# Patient Record
Sex: Male | Born: 1965
Health system: Southern US, Community
[De-identification: ages and names within clinical notes are randomized; demographics above are authoritative.]

## PROBLEM LIST (undated history)

## (undated) DIAGNOSIS — E785 Hyperlipidemia, unspecified: Secondary | ICD-10-CM

## (undated) HISTORY — DX: Hyperlipidemia, unspecified: E78.5

---

## 2010-08-16 ENCOUNTER — Ambulatory Visit (HOSPITAL_COMMUNITY): Admission: RE | Admit: 2010-08-16 | Discharge: 2010-08-16 | Payer: Self-pay | Admitting: Family Medicine

## 2011-10-17 IMAGING — CR DG FOOT COMPLETE 3+V*L*
3 series · 3 of 3 positions shown · non-contrast
Comparison: None

CLINICAL DATA: Left foot pain, attention fifth MTP joint

LEFT FOOT - COMPLETE 3+ VIEW

[view not recorded (1 of 3)]
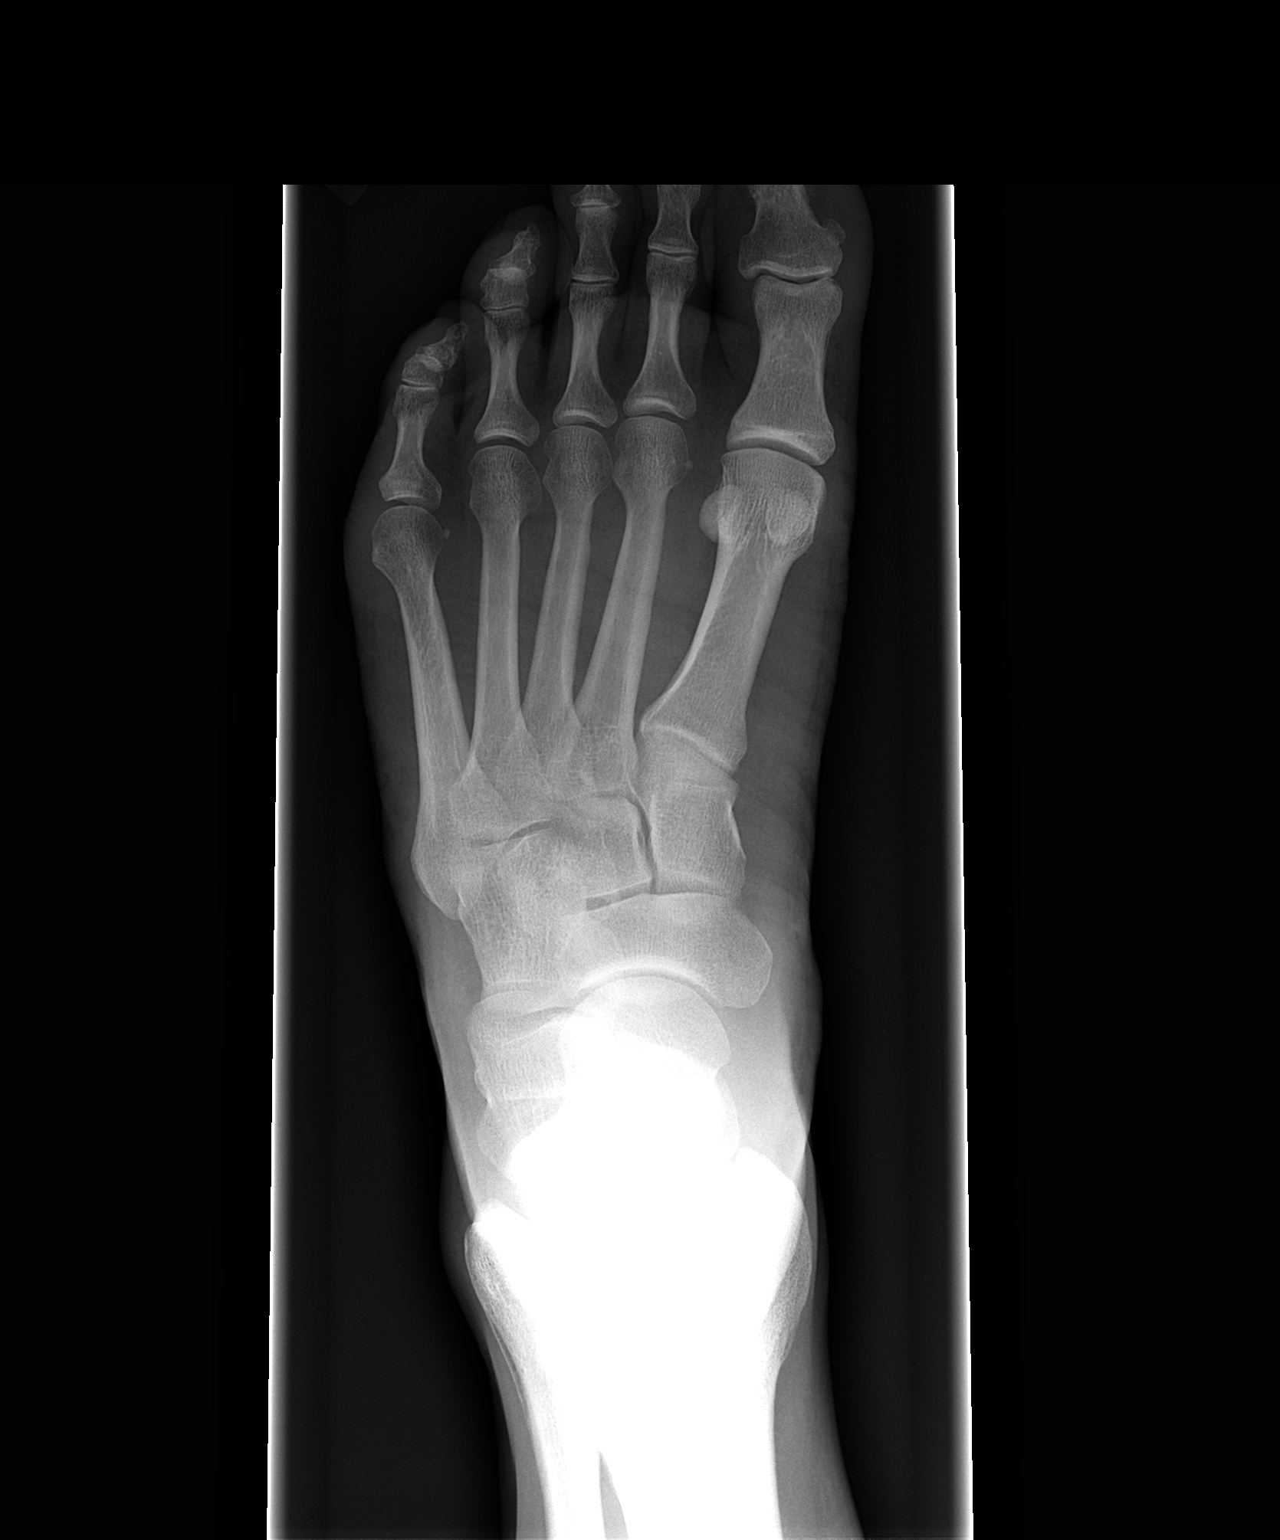

[view not recorded (2 of 3)]
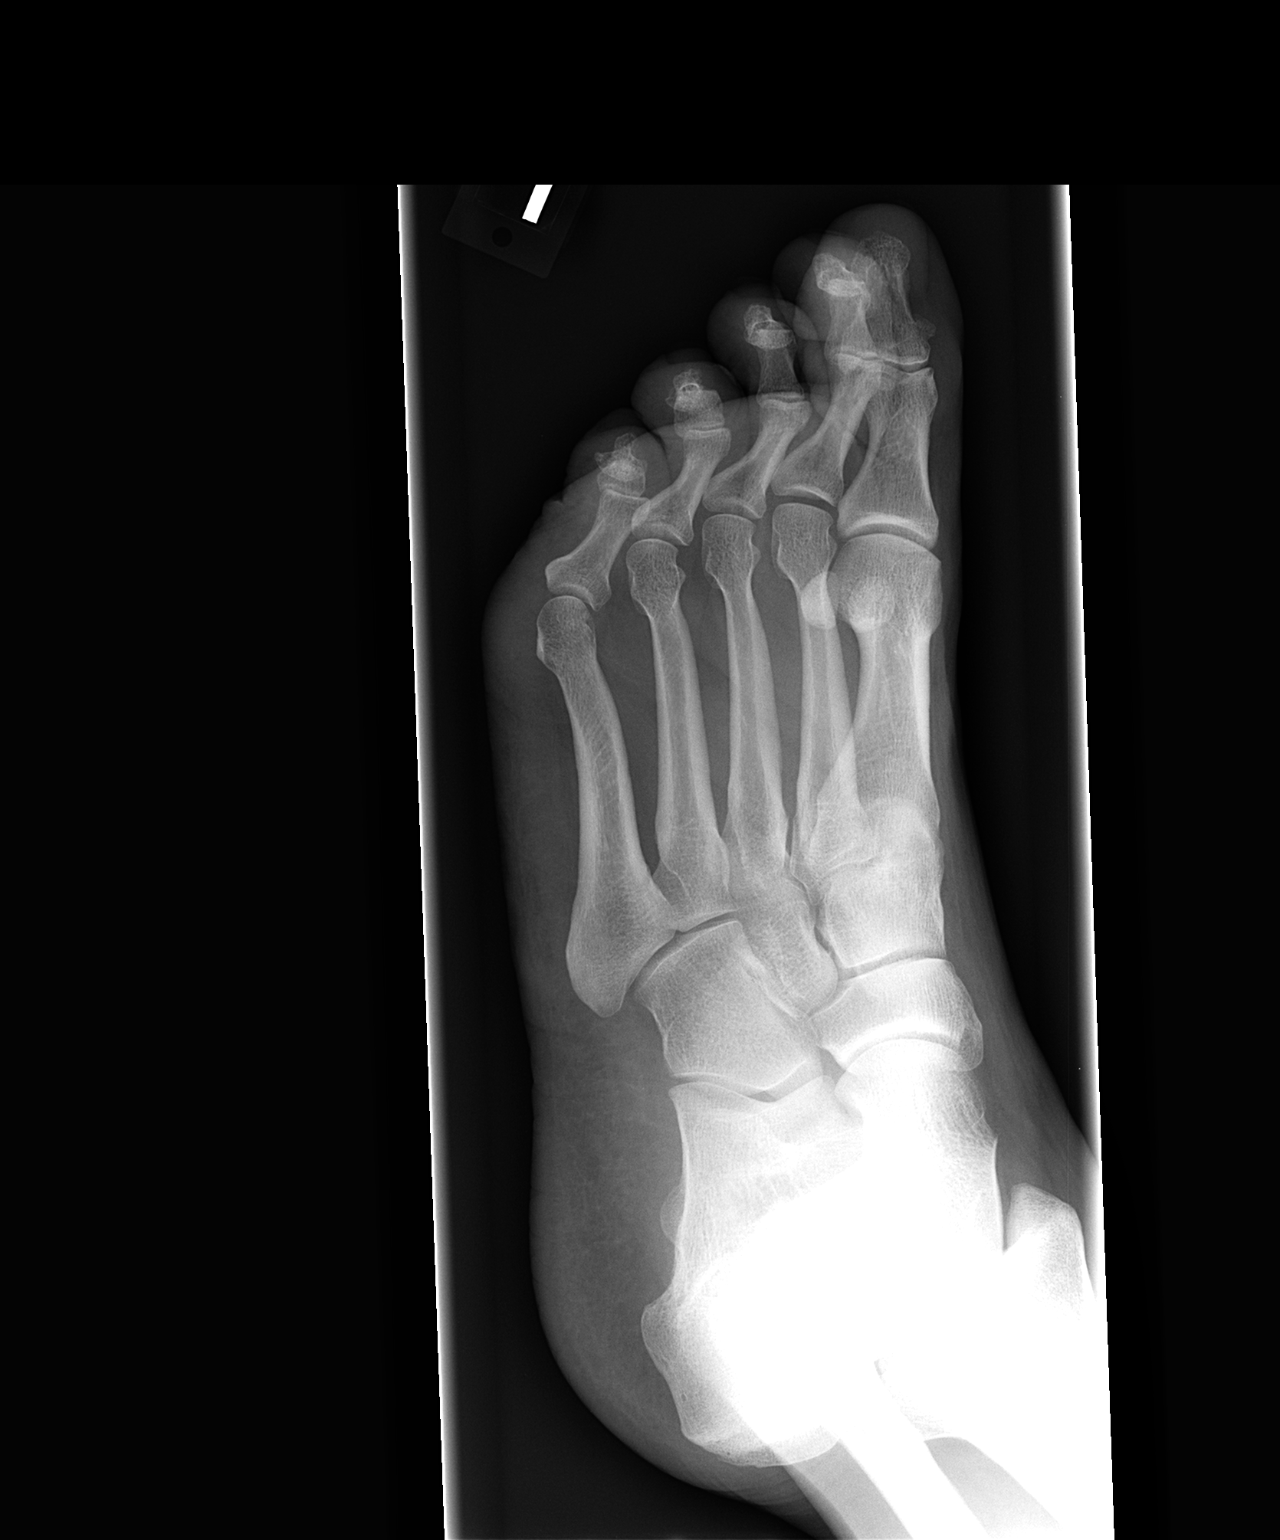

[view not recorded (3 of 3)]
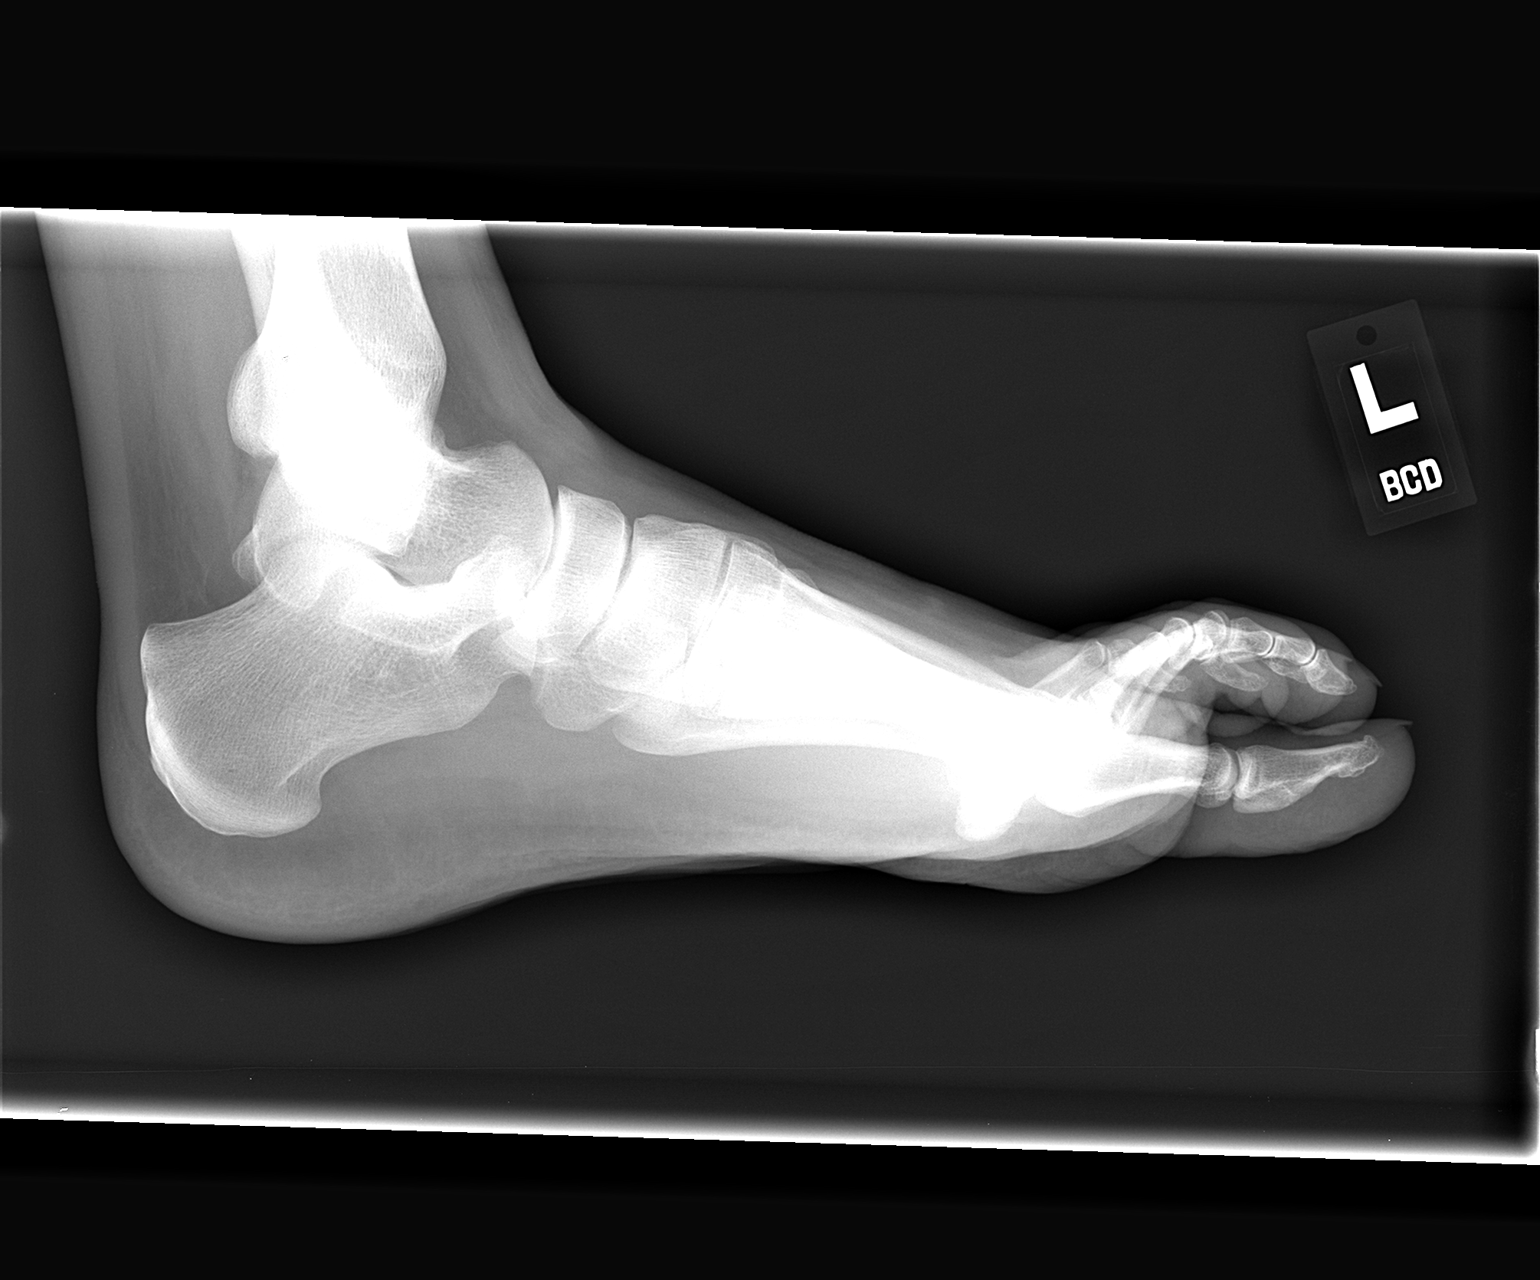

[3 of 3 positions shown; findings below may reference images not displayed]

FINDINGS: Osseous mineralization normal.
Joint spaces preserved.
No acute fracture, dislocation, or bone destruction.
Fifth MTP joint region shows questionable mild lateral soft tissue
swelling, otherwise unremarkable.
IMPRESSION: No acute osseous abnormalities.

## 2014-01-14 ENCOUNTER — Encounter: Payer: Self-pay | Admitting: *Deleted

## 2014-10-08 ENCOUNTER — Encounter: Payer: Self-pay | Admitting: Family Medicine

## 2014-10-08 ENCOUNTER — Ambulatory Visit (INDEPENDENT_AMBULATORY_CARE_PROVIDER_SITE_OTHER): Payer: 59 | Admitting: Family Medicine

## 2014-10-08 VITALS — BP 130/80 | Ht 69.0 in | Wt 184.4 lb

## 2014-10-08 DIAGNOSIS — Z0000 Encounter for general adult medical examination without abnormal findings: Secondary | ICD-10-CM

## 2014-10-08 DIAGNOSIS — Z125 Encounter for screening for malignant neoplasm of prostate: Secondary | ICD-10-CM

## 2014-10-08 NOTE — Progress Notes (Signed)
   Subjective:    Patient ID: Gordon Padilla, male    DOB: 08/23/1966, 48 y.o.   MRN: [id number]  HPI The patient comes in today for a wellness visit.  Stays active at work,   A review of their health history was completed.  A review of medications was also completed.  Any needed refills; N/A  Eating habits: not good  Falls/  MVA accidents in past few months: none  Regular exercise: active at work  Specialist pt sees on regular basis: none  Preventative health issues were discussed.   Additional concerns: Patient would like to talk to the doctor about his neck pain and his bilateral hand pain/numbness. This has been present for awhile now.   No fam hx of colon ca or prost ca  Mo has diabetes. hbp in mo. Slight  Fa has low b p   Diet pt self grades to  Doesn't eat bad, but also room fr improvement  No sig b w recently  Makes it to dent somewhat regularly  Twice per yr  Eye exam yrly, some issues with elev numbers      Review of Systems  Constitutional: Negative for activity change, appetite change and fatigue.  HENT: Negative for congestion, ear discharge and rhinorrhea.   Eyes: Negative for discharge.  Respiratory: Negative for cough, chest tightness and wheezing.   Cardiovascular: Negative for chest pain.  Gastrointestinal: Negative for vomiting and abdominal pain.  Genitourinary: Negative for frequency and difficulty urinating.  Musculoskeletal: Negative for neck pain.  Allergic/Immunologic: Negative for environmental allergies and food allergies.  Neurological: Negative for weakness and headaches.  Psychiatric/Behavioral: Negative for behavioral problems and agitation.  All other systems reviewed and are negative.      Objective:   Physical Exam  Constitutional: She is oriented to person, place, and time. She appears well-developed and well-nourished.  HENT:  Head: Normocephalic.  Right Ear: External ear normal.  Left Ear: External ear  normal.  Eyes: Pupils are equal, round, and reactive to light.  Neck: Normal range of motion. No thyromegaly present.  Cardiovascular: Normal rate, regular rhythm, normal heart sounds and intact distal pulses.   No murmur heard. Pulmonary/Chest: Effort normal and breath sounds normal. No respiratory distress. She has no wheezes.  Abdominal: Soft. Bowel sounds are normal. She exhibits no distension and no mass. There is no tenderness.  Musculoskeletal: Normal range of motion. She exhibits no edema or tenderness.  Lymphadenopathy:    She has no cervical adenopathy.  Neurological: She is alert and oriented to person, place, and time. She exhibits normal muscle tone.  Skin: Skin is warm and dry.  Psychiatric: She has a normal mood and affect. Her behavior is normal.   Of notecomputer has sex reversed. Patient obviously is a male. Prostate gland within normal limits. No rectal mass.       Assessment & Plan:  Impression 1 wellness exam. #2 history of hyperlipidemia.Planappropriate blood work. Patient to call GI doctor this spring when he turns 49. Diet discussed. Exercise discussed. Patient also noted some musculoskeletal and possible neurological symptoms. 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

## 2014-10-25 LAB — BASIC METABOLIC PANEL
BUN: 15 mg/dL (ref 6–23)
CO2: 27 mEq/L (ref 19–32)
Calcium: 9.1 mg/dL (ref 8.4–10.5)
Chloride: 103 mEq/L (ref 96–112)
Creat: 0.93 mg/dL (ref 0.50–1.10)
Glucose, Bld: 88 mg/dL (ref 70–99)
Potassium: 4.4 mEq/L (ref 3.5–5.3)
Sodium: 138 mEq/L (ref 135–145)

## 2014-10-25 LAB — HEPATIC FUNCTION PANEL
ALBUMIN: 4.1 g/dL (ref 3.5–5.2)
ALT: 22 U/L (ref 0–35)
AST: 21 U/L (ref 0–37)
Alkaline Phosphatase: 66 U/L (ref 39–117)
BILIRUBIN TOTAL: 0.4 mg/dL (ref 0.2–1.2)
Bilirubin, Direct: 0.1 mg/dL (ref 0.0–0.3)
Indirect Bilirubin: 0.3 mg/dL (ref 0.2–1.2)
Total Protein: 6.8 g/dL (ref 6.0–8.3)

## 2014-10-25 LAB — LIPID PANEL
CHOL/HDL RATIO: 4.8 ratio
Cholesterol: 220 mg/dL — ABNORMAL HIGH (ref 0–200)
HDL: 46 mg/dL (ref >39)
LDL CALC: 144 mg/dL — AB (ref 0–99)
TRIGLYCERIDES: 152 mg/dL — AB (ref <150)
VLDL: 30 mg/dL (ref 0–40)

## 2014-10-25 LAB — PSA

## 2015-08-23 ENCOUNTER — Telehealth: Payer: Self-pay | Admitting: Family Medicine

## 2015-08-23 DIAGNOSIS — Z1211 Encounter for screening for malignant neoplasm of colon: Secondary | ICD-10-CM

## 2015-08-23 NOTE — Telephone Encounter (Signed)
No ref required have brendale giv e him number

## 2015-08-23 NOTE — Telephone Encounter (Signed)
Pt would like to get his referral with Dr Jena Gaussourk to do his colonoscopy please

## 2015-08-23 NOTE — Telephone Encounter (Signed)
Patient stated office stated he needed a referral. Referral ordered in EPIC. Patient notified.

## 2015-08-24 ENCOUNTER — Encounter: Payer: Self-pay | Admitting: Family Medicine

## 2015-09-08 ENCOUNTER — Telehealth: Payer: Self-pay | Admitting: General Practice

## 2015-09-08 NOTE — Telephone Encounter (Signed)
Patient called in to schedule his screening tcs.  Please call him at [phone]  Routing to [facility]

## 2015-09-15 NOTE — Telephone Encounter (Signed)
LMOM to call to schedule colonoscopy.  

## 2015-09-22 NOTE — Telephone Encounter (Signed)
LMOM to call.

## 2015-09-23 ENCOUNTER — Telehealth: Payer: Self-pay

## 2015-09-23 NOTE — Telephone Encounter (Signed)
See separate traige.  

## 2015-09-28 ENCOUNTER — Other Ambulatory Visit: Payer: Self-pay

## 2015-09-28 DIAGNOSIS — Z1211 Encounter for screening for malignant neoplasm of colon: Secondary | ICD-10-CM

## 2015-09-28 NOTE — Telephone Encounter (Signed)
Gastroenterology Pre-Procedure Review  Request Date: 09/23/2015 Requesting Physician: Dr. Lubertha SouthSteve Luking  PATIENT REVIEW QUESTIONS: The patient responded to the following health history questions as indicated:    1. Diabetes Melitis: no 2. Joint replacements in the past 12 months: no 3. Major health problems in the past 3 months: no 4. Has an artificial valve or MVP: no 5. Has a defibrillator: no 6. Has been advised in past to take antibiotics in advance of a procedure like teeth cleaning: no 7. Family history of colon cancer: no  8. Alcohol Use: no 9. History of sleep apnea: no     MEDICATIONS & ALLERGIES:    Patient reports the following regarding taking any blood thinners:   Plavix? no Aspirin? no Coumadin? no  Patient confirms/reports the following medications:  Current Outpatient Prescriptions  Medication Sig Dispense Refill  . cetirizine (ZYRTEC) 10 MG tablet Take 10 mg by mouth daily.    . Multiple Vitamin (MULTIVITAMIN) tablet Take 1 tablet by mouth daily.     No current facility-administered medications for this visit.    Patient confirms/reports the following allergies:  Allergies  Allergen Reactions  . Penicillins     No orders of the defined types were placed in this encounter.    AUTHORIZATION INFORMATION Primary Insurance:   ID #:  Group #:  Pre-Cert / Auth required: Pre-Cert / Auth #:   Secondary Insurance:   ID #:  Group #:  Pre-Cert / Auth required: Pre-Cert / Auth #:   SCHEDULE INFORMATION: Procedure has been scheduled as follows:  Date: 10/22/2015               Time:  9:30 AM Location: [facility] Short Stay  This Gastroenterology Pre-Precedure Review Form is being routed to the following provider(s): R. Roetta SessionsMichael Rourk, MD

## 2015-09-29 NOTE — Telephone Encounter (Signed)
Appropriate.

## 2015-09-30 MED ORDER — PEG 3350-KCL-NA BICARB-NACL 420 G PO SOLR: 4000.0000 mL | ORAL | Status: DC

## 2015-09-30 NOTE — Telephone Encounter (Signed)
Rx sent to the harmacy and instructions mailed to pt.

## 2015-10-12 ENCOUNTER — Telehealth: Payer: Self-pay

## 2015-10-12 NOTE — Telephone Encounter (Signed)
Pt called and confirmed his insurance.

## 2015-10-12 NOTE — Telephone Encounter (Signed)
I called pt and Left Vm for a return call. I have questions about his insurance.

## 2015-10-13 ENCOUNTER — Telehealth: Payer: Self-pay

## 2015-10-13 NOTE — Telephone Encounter (Signed)
I called [employer]@ [phone] and spoke to Zella BallRobin A who said that a precert is not required for the screening colonoscopy.

## 2015-10-22 ENCOUNTER — Encounter (HOSPITAL_COMMUNITY)
Admission: RE | Disposition: A | Discharge status: Home / Self Care | Payer: Self-pay | Source: Ambulatory Visit | Attending: Internal Medicine

## 2015-10-22 ENCOUNTER — Ambulatory Visit (HOSPITAL_COMMUNITY)
Admission: RE | Admit: 2015-10-22 | Discharge: 2015-10-22 | Disposition: A | Discharge status: Home / Self Care | Payer: PRIVATE HEALTH INSURANCE | Source: Ambulatory Visit | Attending: Internal Medicine | Admitting: Internal Medicine

## 2015-10-22 ENCOUNTER — Encounter (HOSPITAL_COMMUNITY): Payer: Self-pay | Admitting: *Deleted

## 2015-10-22 DIAGNOSIS — Z1211 Encounter for screening for malignant neoplasm of colon: Secondary | ICD-10-CM | POA: Insufficient documentation

## 2015-10-22 HISTORY — PX: COLONOSCOPY: SHX5424

## 2015-10-22 SURGERY — COLONOSCOPY
Anesthesia: Moderate Sedation

## 2015-10-22 MED ORDER — ONDANSETRON HCL 4 MG/2ML IJ SOLN
INTRAMUSCULAR | Status: AC
  Filled 2015-10-22: qty 2

## 2015-10-22 MED ORDER — MIDAZOLAM HCL 5 MG/5ML IJ SOLN
INTRAMUSCULAR | Status: DC | PRN
  Administered 2015-10-22 (×2): 2 mg via INTRAVENOUS
  Administered 2015-10-22 (×2): 1 mg via INTRAVENOUS

## 2015-10-22 MED ORDER — MEPERIDINE HCL 100 MG/ML IJ SOLN
INTRAMUSCULAR | Status: AC
  Filled 2015-10-22: qty 2

## 2015-10-22 MED ORDER — MIDAZOLAM HCL 5 MG/5ML IJ SOLN
INTRAMUSCULAR | Status: AC
  Filled 2015-10-22: qty 10

## 2015-10-22 MED ORDER — SODIUM CHLORIDE 0.9 % IV SOLN
INTRAVENOUS | Status: DC
  Administered 2015-10-22: 1000 mL via INTRAVENOUS

## 2015-10-22 MED ORDER — MEPERIDINE HCL 100 MG/ML IJ SOLN
INTRAMUSCULAR | Status: DC | PRN
  Administered 2015-10-22: 25 mg via INTRAVENOUS
  Administered 2015-10-22: 50 mg via INTRAVENOUS
  Administered 2015-10-22: 25 mg via INTRAVENOUS

## 2015-10-22 MED ORDER — ONDANSETRON HCL 4 MG/2ML IJ SOLN
INTRAMUSCULAR | Status: DC | PRN
  Administered 2015-10-22: 4 mg via INTRAVENOUS

## 2015-10-22 MED ORDER — STERILE WATER FOR IRRIGATION IR SOLN
Status: DC | PRN
  Administered 2015-10-22: 09:00:00

## 2015-10-22 NOTE — H&P (Signed)
@  [email or web address]   Primary Care Physician:  Lubertha SouthSteve Luking, MD Primary Gastroenterologist:  Dr.   Pre-Procedure History & Physical: HPI:  Gordon Padilla is a 50 y.o. male is here for a screening colonoscopy.   Past Medical History  Diagnosis Date  . Hyperlipidemia     History reviewed. No pertinent past surgical history.  Prior to Admission medications   Medication Sig Start Date End Date Taking? Authorizing Provider  cetirizine (ZYRTEC) 10 MG tablet Take 10 mg by mouth daily.   Yes Historical Provider, MD  Multiple Vitamin (MULTIVITAMIN) tablet Take 1 tablet by mouth daily.   Yes Historical Provider, MD  polyethylene glycol-electrolytes (TRILYTE) 420 G solution Take 4,000 mLs by mouth as directed. 09/30/15  Yes Corbin Adeobert M Lamyia Cdebaca, MD    Allergies as of 09/28/2015 - Review Complete 09/23/2015  Allergen Reaction Noted  . Penicillins  01/14/2014    Family History  Problem Relation Age of Onset  . Stroke Mother   . Hypertension Mother   . Hypertension Father     Social History   Social History  . Marital Status: Married    Spouse Name: N/A  . Number of Children: N/A  . Years of Education: N/A   Occupational History  . Not on file.   Social History Main Topics  . Smoking status: Never Smoker   . Smokeless tobacco: Not on file  . Alcohol Use: No  . Drug Use: No  . Sexual Activity: Not on file   Other Topics Concern  . Not on file   Social History Narrative    Review of Systems: See HPI, otherwise negative ROS  Physical Exam: BP 132/86 mmHg  Pulse 88  Temp(Src) 98.4 F (36.9 C) (Oral)  Resp 16  Ht 5\' 10"  (1.778 m)  Wt 170 lb (77.111 kg)  BMI 24.39 kg/m2  SpO2 99% General:   Alert,  Well-developed, well-nourished, pleasant and cooperative in NAD Head:  Normocephalic and atraumatic. Lungs:  Clear throughout to auscultation.   No wheezes, crackles, or rhonchi. No acute distress. Heart:  Regular rate and rhythm; no murmurs, clicks, rubs,  or gallops. Abdomen:   Soft, nontender and nondistended. No masses, hepatosplenomegaly or hernias noted. Normal bowel sounds, without guarding, and without rebound.   Extremities:  Without clubbing or edema.   Impression/Plan: Gordon Mallingavid J Padilla is now here to undergo a screening colonoscopy.  First-ever average risk screening examination  Risks, benefits, limitations, imponderables and alternatives regarding colonoscopy have been reviewed with the patient. Questions have been answered. All parties agreeable.     Notice:  This dictation was prepared with Dragon dictation along with smaller phrase technology. Any transcriptional errors that result from this process are unintentional and may not be corrected upon review.

## 2015-10-22 NOTE — Discharge Instructions (Signed)

## 2015-10-22 NOTE — Op Note (Signed)
[facility] [address] [place], [id number]   COLONOSCOPY PROCEDURE REPORT  PATIENT: [name], Gordon J  MR#: [id number] BIRTHDATE: 1966-07-19 , 49  yrs. old GENDER: male ENDOSCOPIST: R.  Roetta SessionsMichael Rourk, MD FACP [facility] REFERRED MV:HQIONGEBY:Stephen Gerda DissLuking, M.D. PROCEDURE DATE:  10/22/2015 PROCEDURE:   Colonoscopy, screening INDICATIONS:First-ever average risk screening examination. MEDICATIONS: Versed 6 mg IV and Demerol 100 mg IV in divided doses. Zofran 4 mg IV. ASA CLASS:       Class II  CONSENT: The risks, benefits, alternatives and imponderables including but not limited to bleeding, perforation as well as the possibility of a missed lesion have been reviewed.  The potential for biopsy, lesion removal, etc. have also been discussed. Questions have been answered.  All parties agreeable.  Please see the history and physical in the medical record for more information.  DESCRIPTION OF PROCEDURE:   After the risks benefits and alternatives of the procedure were thoroughly explained, informed consent was obtained.  The digital rectal exam revealed no abnormalities of the rectum.   The EC-[id number] ([id number]  endoscope was introduced through the anus and advanced to the cecum, which was identified by both the appendix and ileocecal valve. No adverse events experienced.   The quality of the prep was adequate  The instrument was then slowly withdrawn as the colon was fully examined. Estimated blood loss is zero unless otherwise noted in this procedure report.      COLON FINDINGS: Normal-appearing rectal mucosa.  Normal-appearing colonic mucosa.  Retroflexion was performed. .  Withdrawal time=7 minutes 0 seconds.  The scope was withdrawn and the procedure completed. COMPLICATIONS: There were no immediate complications. Begin sedation at 0911; end sedation 09 37   ENDOSCOPIC IMPRESSION: Normal colonoscopy  RECOMMENDATIONS: Repeat screening colonoscopy in 10  years  eSigned:  R. Roetta SessionsMichael Rourk, MD Jerrel IvoryFACP [facility] 10/22/2015 9:43 AM   cc:  CPT CODES: ICD CODES:  The ICD and CPT codes recommended by this software are interpretations from the data that the clinical staff has captured with the software.  The verification of the translation of this report to the ICD and CPT codes and modifiers is the sole responsibility of the health care institution and practicing physician where this report was generated.  [employer] will not be held responsible for the validity of the ICD and CPT codes included on this report.  AMA assumes no liability for data contained or not contained herein. CPT is a Publishing rights managerregistered trademark of the [employer].

## 2015-10-26 ENCOUNTER — Encounter (HOSPITAL_COMMUNITY): Payer: Self-pay | Admitting: Internal Medicine

## 2016-01-18 ENCOUNTER — Encounter: Payer: Self-pay | Admitting: Family Medicine

## 2016-01-18 ENCOUNTER — Ambulatory Visit (INDEPENDENT_AMBULATORY_CARE_PROVIDER_SITE_OTHER): Payer: No Typology Code available for payment source | Admitting: Family Medicine

## 2016-01-18 VITALS — BP 132/82 | Ht 69.0 in | Wt 177.8 lb

## 2016-01-18 DIAGNOSIS — M79641 Pain in right hand: Secondary | ICD-10-CM | POA: Diagnosis not present

## 2016-01-18 DIAGNOSIS — M7712 Lateral epicondylitis, left elbow: Secondary | ICD-10-CM | POA: Diagnosis not present

## 2016-01-18 DIAGNOSIS — G5603 Carpal tunnel syndrome, bilateral upper limbs: Secondary | ICD-10-CM | POA: Diagnosis not present

## 2016-01-18 DIAGNOSIS — Z79899 Other long term (current) drug therapy: Secondary | ICD-10-CM

## 2016-01-18 DIAGNOSIS — E785 Hyperlipidemia, unspecified: Secondary | ICD-10-CM | POA: Diagnosis not present

## 2016-01-18 DIAGNOSIS — M6530 Trigger finger, unspecified finger: Secondary | ICD-10-CM

## 2016-01-18 DIAGNOSIS — Z125 Encounter for screening for malignant neoplasm of prostate: Secondary | ICD-10-CM | POA: Diagnosis not present

## 2016-01-18 DIAGNOSIS — M2550 Pain in unspecified joint: Secondary | ICD-10-CM

## 2016-01-18 MED ORDER — ETODOLAC 400 MG PO TABS: 400.0000 mg | ORAL_TABLET | Freq: Two times a day (BID) | ORAL | Status: DC | PRN

## 2016-01-18 MED FILL — ETODOLAC 400 MG TABLET: 400 | 21 days supply | Qty: 42 | Fill #0

## 2016-01-18 NOTE — Progress Notes (Signed)
   Subjective:    Patient ID: Gordon Padilla, male    DOB: 02/09/1966, 50 y.o.   MRN: [id number] Patient arrives office for at least 4 distinct orthopedic concerns HPI  Patient arrives with c/o swelling and pain in hands and arms for a while.  Going on for yrs  Right hand more painful than left  Has swelling worse in the morn  Often tight in the hand  Trigger finger in right hand  Numbness and tingling also in both hands, with rad of discomfort into the right forearm  Left lat elbow pain, cabinet maker  Numbness and tingling occurs  Pain in right hand persist s thru the day    Review of Systems No headache no chest pain no back pain no abdominal pain no change in bowel habits    Objective:   Physical Exam  Alert vital stable neck painful with full rotation lungs clear heart regular in rhythm left lateral epicondyles distinctly tender. Plus minus Phalen's sign bilateral right hand trigger finger palpated mild tenderness and swelling at base of metacarpal. Both hands review some slight edema.      Assessment & Plan:  Impression 1 bilateral carpal tunnel syndrome discussed at length #2 left lateral epicondylitis also discussed length #3 trigger finger right middle finger right hand discuss #4 progressive hand pain with swelling and stiffness in the morning. 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 Likely not serious autoimmune inflammatory arthritis but always possibility discussed plan x-ray right hand. Rheumatoid factor and sedimentation rate along with screening blood work. Form strap left arm for lateral epicondylitis discussed bilateral splints nocturnal for carpal tunnel syndrome discussed long-term need for intervention on trigger finger discussed anti-inflammatories prescribed 25 minutes spent most in discussion

## 2016-01-24 LAB — BASIC METABOLIC PANEL
BUN / CREAT RATIO: 10 (ref 9–20)
BUN: 9 mg/dL (ref 6–24)
CHLORIDE: 97 mmol/L (ref 96–106)
CO2: 24 mmol/L (ref 18–29)
Calcium: 9.7 mg/dL (ref 8.7–10.2)
Creatinine, Ser: 0.86 mg/dL (ref 0.76–1.27)
GFR, EST AFRICAN AMERICAN: 116 mL/min/{1.73_m2} (ref >59)
GFR, EST NON AFRICAN AMERICAN: 100 mL/min/{1.73_m2} (ref >59)
Glucose: 83 mg/dL (ref 65–99)
POTASSIUM: 4.5 mmol/L (ref 3.5–5.2)
SODIUM: 142 mmol/L (ref 134–144)

## 2016-01-24 LAB — HEPATIC FUNCTION PANEL
ALBUMIN: 4.6 g/dL (ref 3.5–5.5)
ALK PHOS: 73 IU/L (ref 39–117)
ALT: 15 IU/L (ref 0–44)
AST: 23 IU/L (ref 0–40)
BILIRUBIN TOTAL: 0.5 mg/dL (ref 0.0–1.2)
Bilirubin, Direct: 0.13 mg/dL (ref 0.00–0.40)
Total Protein: 7.2 g/dL (ref 6.0–8.5)

## 2016-01-24 LAB — LIPID PANEL
CHOLESTEROL TOTAL: 208 mg/dL — AB (ref 100–199)
Chol/HDL Ratio: 5.2 ratio units — ABNORMAL HIGH (ref 0.0–5.0)
HDL: 40 mg/dL (ref >39)
LDL Calculated: 123 mg/dL — ABNORMAL HIGH (ref 0–99)
TRIGLYCERIDES: 226 mg/dL — AB (ref 0–149)
VLDL Cholesterol Cal: 45 mg/dL — ABNORMAL HIGH (ref 5–40)

## 2016-01-24 LAB — PSA: PROSTATE SPECIFIC AG, SERUM: 0.7 ng/mL (ref 0.0–4.0)

## 2016-01-24 LAB — SEDIMENTATION RATE: SED RATE: 2 mm/h (ref 0–30)

## 2016-01-24 LAB — RHEUMATOID FACTOR

## 2016-01-27 ENCOUNTER — Ambulatory Visit (HOSPITAL_COMMUNITY)
Admission: RE | Admit: 2016-01-27 | Discharge: 2016-01-27 | Disposition: A | Discharge status: Home / Self Care | Payer: PRIVATE HEALTH INSURANCE | Source: Ambulatory Visit | Attending: Family Medicine | Admitting: Family Medicine

## 2016-01-27 DIAGNOSIS — M2550 Pain in unspecified joint: Secondary | ICD-10-CM | POA: Insufficient documentation

## 2016-01-27 DIAGNOSIS — M79641 Pain in right hand: Secondary | ICD-10-CM | POA: Diagnosis present

## 2016-02-21 ENCOUNTER — Encounter: Payer: Self-pay | Admitting: Family Medicine

## 2016-02-21 ENCOUNTER — Ambulatory Visit (INDEPENDENT_AMBULATORY_CARE_PROVIDER_SITE_OTHER): Payer: No Typology Code available for payment source | Admitting: Family Medicine

## 2016-02-21 VITALS — BP 132/86 | Temp 98.4°F | Ht 69.0 in | Wt 180.2 lb

## 2016-02-21 DIAGNOSIS — H6121 Impacted cerumen, right ear: Secondary | ICD-10-CM

## 2016-02-21 NOTE — Progress Notes (Signed)
   Subjective:    Patient ID: Gordon Padilla, male    DOB: 02/03/1966, 50 y.o.   MRN: [id number]  Otalgia  There is pain in the right ear. The current episode started 1 to 4 weeks ago. The patient is experiencing no pain. Associated symptoms comments: Difficulty hearing.Marland Kitchen. He has tried ear drops (Flonase nasal spray,) for the symptoms.   Patient states no other concerns this visit.   Review of Systems  HENT: Positive for ear pain.        Objective:   Physical Exam  Cerumen impaction on right side noted left side normal throat normal neck supple lungs clear      Assessment & Plan:  Cerumen impaction referral to ENT urgent removal patient going on vacation next week

## 2016-04-24 ENCOUNTER — Encounter: Payer: Self-pay | Admitting: Family Medicine

## 2016-04-24 ENCOUNTER — Ambulatory Visit (INDEPENDENT_AMBULATORY_CARE_PROVIDER_SITE_OTHER): Payer: No Typology Code available for payment source | Admitting: Family Medicine

## 2016-04-24 VITALS — BP 130/90 | Ht 69.5 in | Wt 184.0 lb

## 2016-04-24 DIAGNOSIS — Z0000 Encounter for general adult medical examination without abnormal findings: Secondary | ICD-10-CM | POA: Diagnosis not present

## 2016-04-24 NOTE — Progress Notes (Signed)
   Subjective:    Patient ID: Gordon Padilla, male    DOB: 09/17/1966, 50 y.o.   MRN: [id number]  HPI The patient comes in today for a wellness visit.  Self grades diet as good not perfect, eatng more veggies  A review of their health history was completed.  A review of medications was also completed.  Any needed refills: none  Eating habits: good  Falls/  MVA accidents in past few months: none  Regular exercise: yes at work  Specialist pt sees on regular basis: none  Preventative health issues were discussed.   Additional concerns: update on elbow and hand pain   Wearing night time splints and helping sigmificantly  Left elbow falred up,     Colonoscopy in jan, due in ten yrs    Review of Systems  Constitutional: Negative for fever, activity change and appetite change.  HENT: Negative for congestion and rhinorrhea.   Eyes: Negative for discharge.  Respiratory: Negative for cough and wheezing.   Cardiovascular: Negative for chest pain.  Gastrointestinal: Negative for vomiting, abdominal pain and blood in stool.  Genitourinary: Negative for frequency and difficulty urinating.  Musculoskeletal: Negative for neck pain.  Skin: Negative for rash.  Allergic/Immunologic: Negative for environmental allergies and food allergies.  Neurological: Negative for weakness and headaches.  Psychiatric/Behavioral: Negative for agitation.  All other systems reviewed and are negative.      Objective:   Physical Exam  Constitutional: He appears well-developed and well-nourished.  HENT:  Head: Normocephalic and atraumatic.  Right Ear: External ear normal.  Left Ear: External ear normal.  Nose: Nose normal.  Mouth/Throat: Oropharynx is clear and moist.  Eyes: EOM are normal. Pupils are equal, round, and reactive to light.  Neck: Normal range of motion. Neck supple. No thyromegaly present.  Cardiovascular: Normal rate, regular rhythm and normal heart sounds.   No murmur  heard. Pulmonary/Chest: Effort normal and breath sounds normal. No respiratory distress. He has no wheezes.  Abdominal: Soft. Bowel sounds are normal. He exhibits no distension and no mass. There is no tenderness.  Genitourinary: Penis normal.  Musculoskeletal: Normal range of motion. He exhibits no edema.  Lymphadenopathy:    He has no cervical adenopathy.  Neurological: He is alert. He exhibits normal muscle tone.  Skin: Skin is warm and dry. No erythema.  Psychiatric: He has a normal mood and affect. His behavior is normal. Judgment normal.  Vitals reviewed.  Prostate exam within normal limits       Assessment & Plan:  Impression wellness exam #2 blood work from the spring reviewed once again borderline lipid status discussed #3 chronic orthopedic concerns improved considerably with wrist splints and elbow strap plan diet exercise discussed. Up-to-date on colonoscopy. Exercise encourage. WSL

## 2017-03-29 IMAGING — DX DG HAND COMPLETE 3+V*R*
3 series · 3 of 3 positions shown · non-contrast
Comparison: None in PACs

CLINICAL DATA: Pain at the third metacarpal and in the palm of the
hand associated with swelling for the past 2-3 years; patient is Fy
Raman Moynihan but notes no discrete injury

EXAM:
RIGHT HAND - COMPLETE 3+ VIEW

[hand pa]
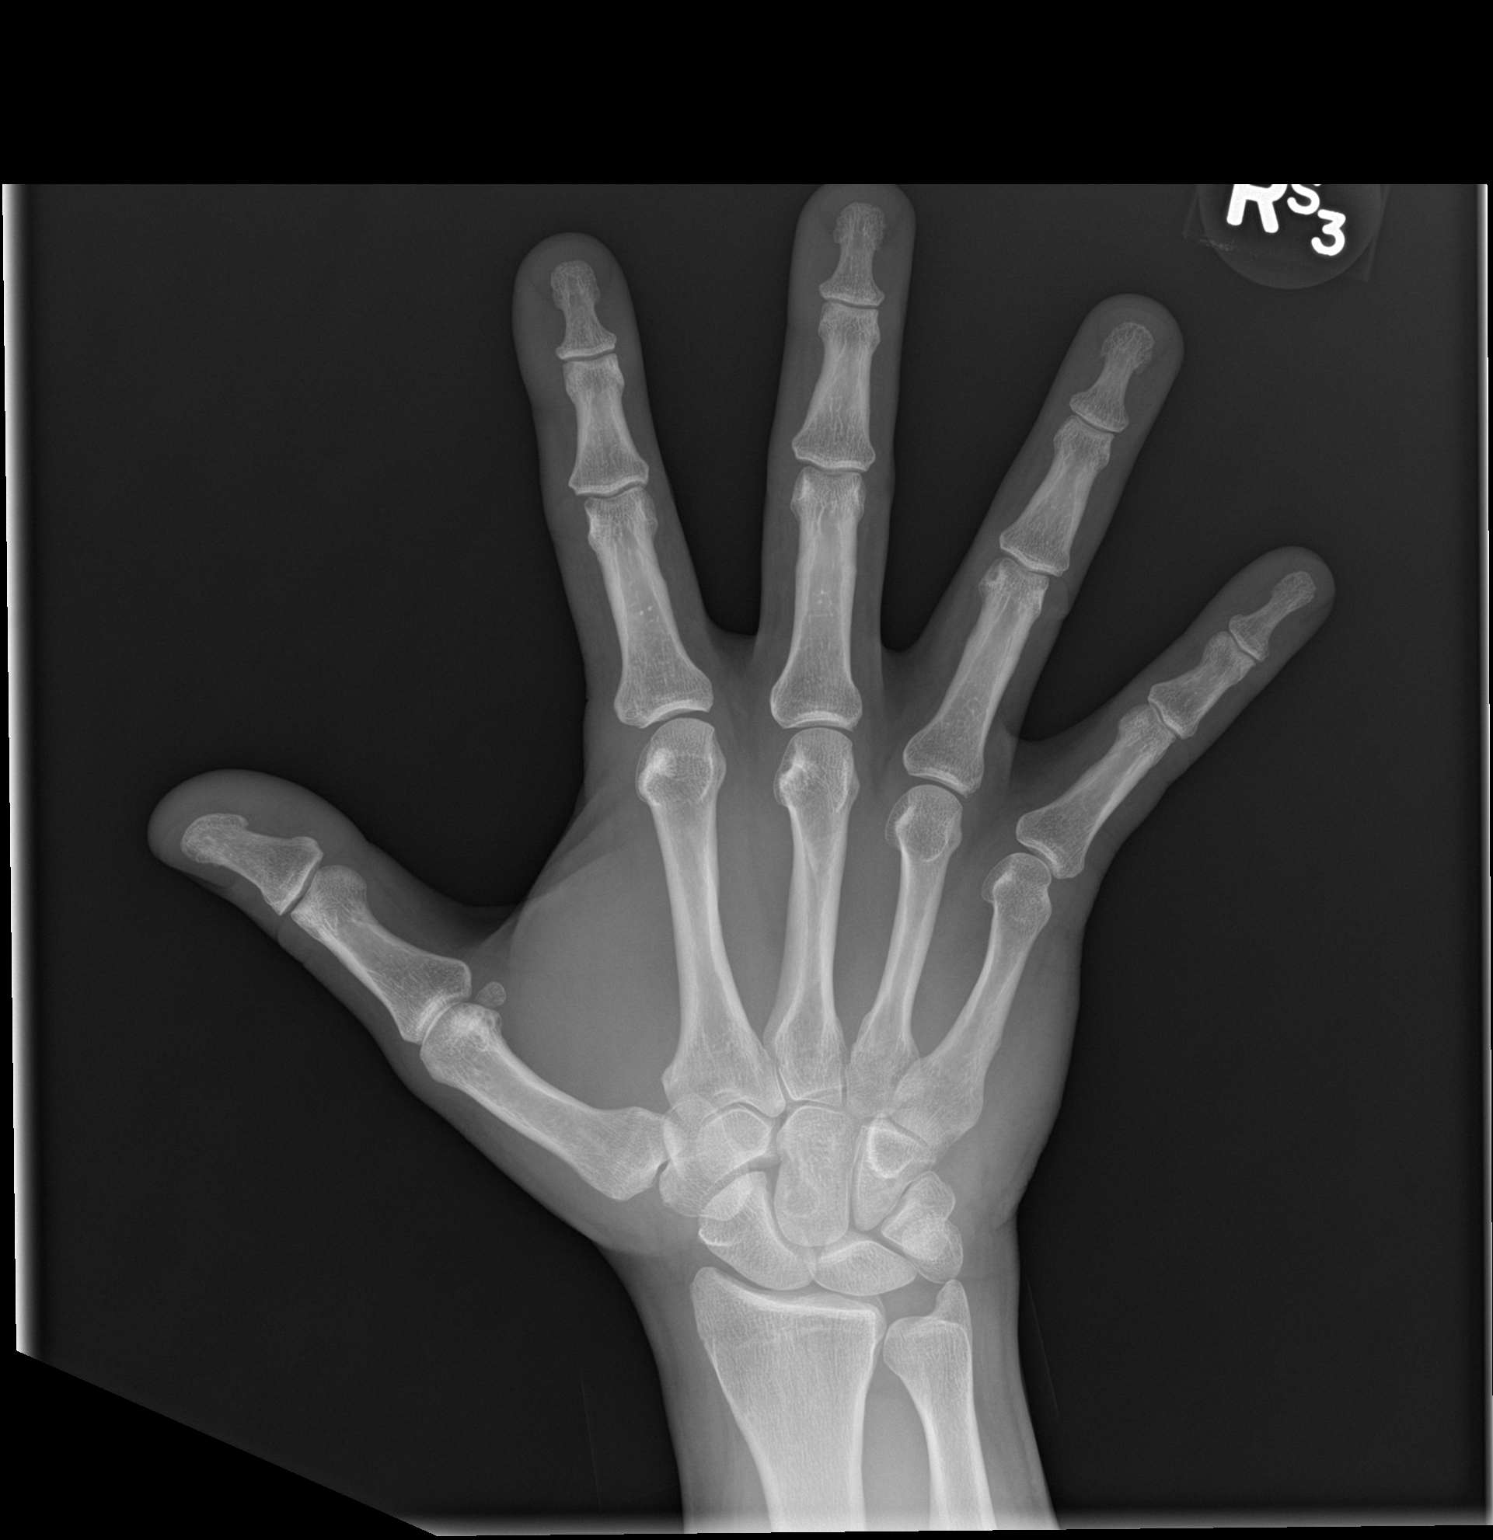

[hand obl]
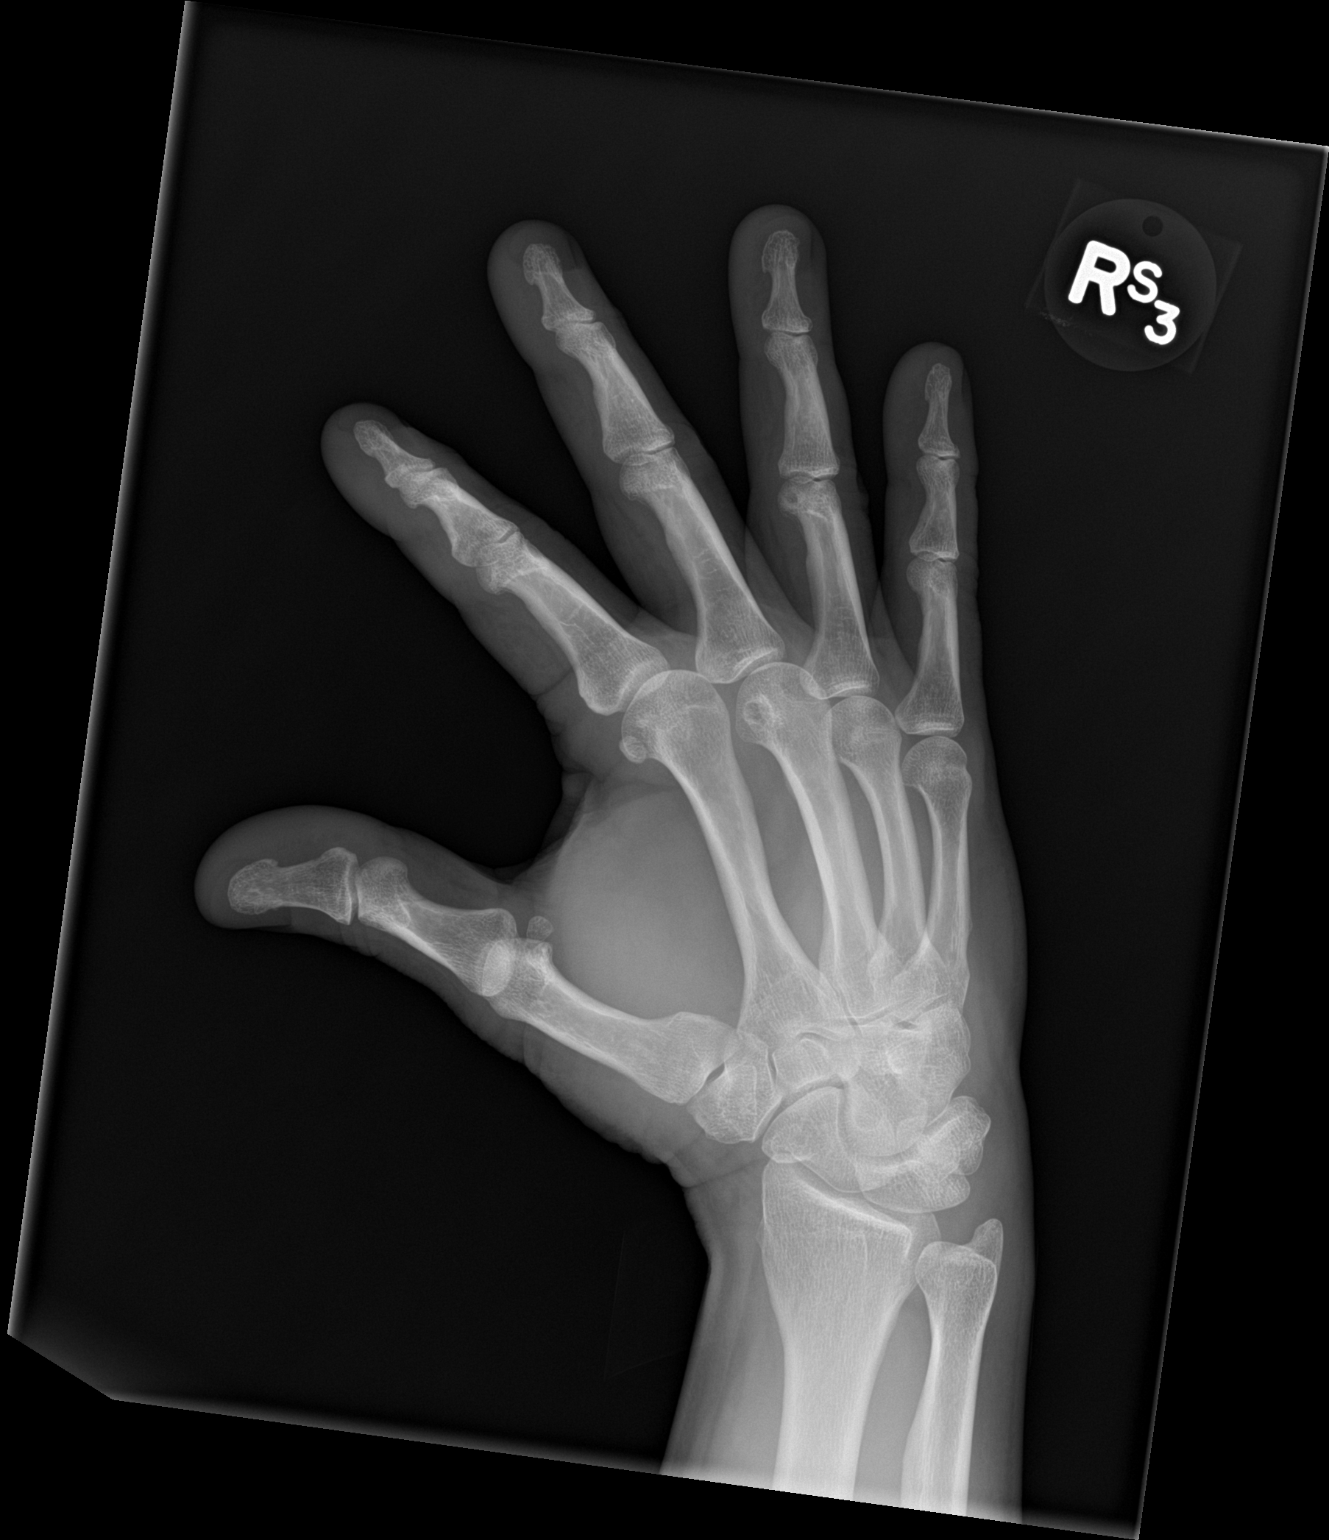

[hand lat]
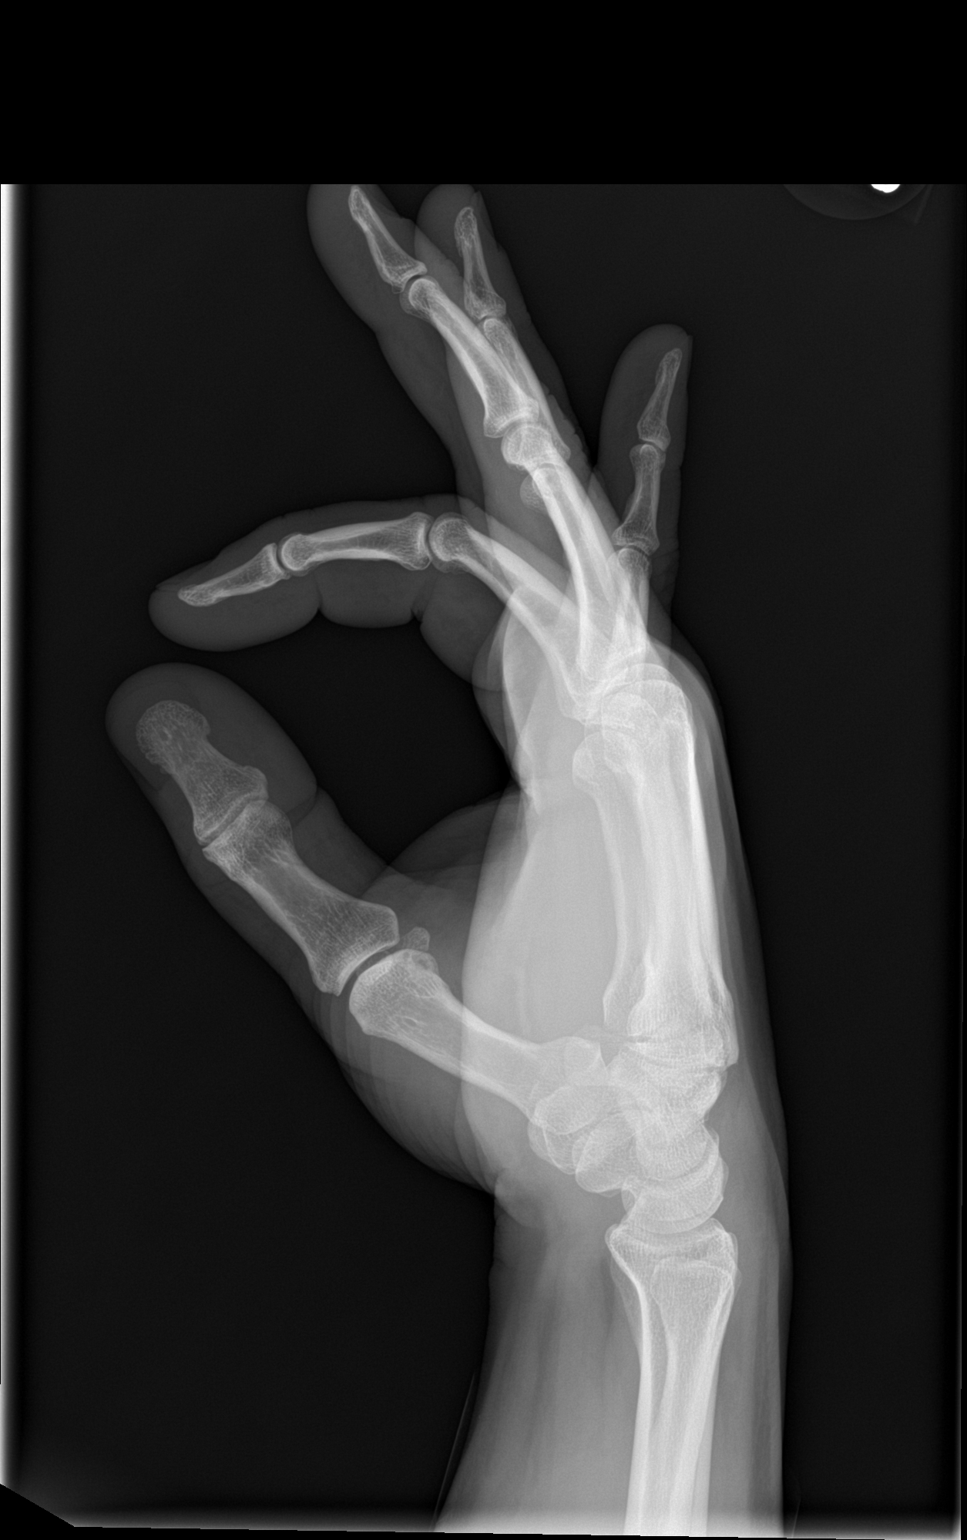

[3 of 3 positions shown; findings below may reference images not displayed]

FINDINGS: The bones are adequately mineralized. There is no acute or old
fracture. The joint spaces are preserved. The soft tissues are
unremarkable.
IMPRESSION: No acute or chronic bony abnormality is observed. There is no
definite soft tissue abnormality either. If further imaging is
indicated clinically, MRI of the right hand would likely be the most
useful next imaging step.

## 2019-11-19 ENCOUNTER — Encounter: Payer: Self-pay | Admitting: Family Medicine

## 2020-02-18 ENCOUNTER — Ambulatory Visit (INDEPENDENT_AMBULATORY_CARE_PROVIDER_SITE_OTHER): Payer: BC Managed Care – PPO | Admitting: Podiatry

## 2020-02-18 ENCOUNTER — Other Ambulatory Visit: Payer: Self-pay

## 2020-02-18 ENCOUNTER — Other Ambulatory Visit: Payer: Self-pay | Admitting: Podiatry

## 2020-02-18 ENCOUNTER — Encounter: Payer: Self-pay | Admitting: Podiatry

## 2020-02-18 ENCOUNTER — Ambulatory Visit: Payer: BC Managed Care – PPO

## 2020-02-18 ENCOUNTER — Ambulatory Visit (INDEPENDENT_AMBULATORY_CARE_PROVIDER_SITE_OTHER): Payer: BC Managed Care – PPO

## 2020-02-18 VITALS — BP 180/90 | HR 86 | Temp 97.8°F | Resp 16

## 2020-02-18 DIAGNOSIS — M79671 Pain in right foot: Secondary | ICD-10-CM | POA: Diagnosis not present

## 2020-02-18 DIAGNOSIS — M779 Enthesopathy, unspecified: Secondary | ICD-10-CM | POA: Diagnosis not present

## 2020-02-18 DIAGNOSIS — M79672 Pain in left foot: Secondary | ICD-10-CM

## 2020-02-18 NOTE — Progress Notes (Signed)
   Subjective:    Patient ID: Gordon Padilla, male    DOB: 1966-02-23, 54 y.o.   MRN: [id number]  HPI    Review of Systems  All other systems reviewed and are negative.      Objective:   Physical Exam        Assessment & Plan:

## 2020-02-18 NOTE — Progress Notes (Signed)
Subjective:   Patient ID: Gordon Padilla, male   DOB: 54 y.o.   MRN: [id number]   HPI Patient presents stating for about a year he has had pain in the bottom of his right foot and it intensified when he was at [place] last month.  States he gets very sore and at times it bothers him and other times he does not have significant pain.  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       Objective:  Physical Exam Vitals and nursing note reviewed.  Constitutional:      Appearance: He is well-developed.  Pulmonary:     Effort: Pulmonary effort is normal.  Musculoskeletal:        General: Normal range of motion.  Skin:    General: Skin is warm.  Neurological:     Mental Status: He is alert.     Neurovascular status intact muscle strength was found to be adequate range of motion within normal limits.  Patient has moderate discomfort with inflammation around the tibial sesamoidal complex right localized with no indications of plantarflexed metatarsal or equinus condition.  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  I did discuss that it appears there is a crack line in the sesamoid but it is been there for a long time so I do think the condition is more inflammatory.  I did sterile prep and injected the plantar capsule 3 mg dexamethasone Kenalog 5 mg Xylocaine and applied dancers pad to offload the area.  Discussed the possibility for tibial sesamoidectomy if symptoms were to persist  X-rays indicate that there is a center crack line of the tibial sesamoid right with no other pathology noted

## 2021-09-13 ENCOUNTER — Ambulatory Visit
Admission: EM | Admit: 2021-09-13 | Discharge: 2021-09-13 | Disposition: A | Discharge status: Home / Self Care | Payer: 59

## 2021-09-13 ENCOUNTER — Other Ambulatory Visit: Payer: Self-pay

## 2021-09-13 DIAGNOSIS — R197 Diarrhea, unspecified: Secondary | ICD-10-CM | POA: Diagnosis not present

## 2021-09-13 DIAGNOSIS — J111 Influenza due to unidentified influenza virus with other respiratory manifestations: Secondary | ICD-10-CM | POA: Diagnosis not present

## 2021-09-13 NOTE — Discharge Instructions (Addendum)
-  Take the Imodium (loperamide) up to 4 times daily for diarrhea. -Drink plenty of fluids -With a virus, you're typically contagious for 5-7 days, or as long as you're having fevers.

## 2021-09-13 NOTE — ED Provider Notes (Signed)
[facility]    CSN: [id number] Arrival date & time: 09/13/21  8144      History   Chief Complaint No chief complaint on file.   HPI Gordon Padilla is a 55 y.o. male presenting with diarrhea on day 7 of the flu.  Medical history noncontributory.  States that initially he had generalized body aches, fevers and chills, malaise, decreased appetite, nasal congestion.  Symptoms have entirely resolved, but he did have 3 episodes of watery diarrhea 1 day ago, and 1 episode of watery diarrhea today.  He has taken 1 to 2 pills of Imodium daily with complete relief of the diarrhea.  Denies any abdominal pain, nausea, vomiting.  Tolerating fluids and food despite decreased appetite.  Denies shortness of breath, chest pain, dizziness.  Fevers have resolved.  HPI  Past Medical History:  Diagnosis Date   Hyperlipidemia     Patient Active Problem List   Diagnosis Date Noted   Special screening for malignant neoplasms, colon     Past Surgical History:  Procedure Laterality Date   COLONOSCOPY N/A 10/22/2015   Procedure: COLONOSCOPY;  Surgeon: Corbin Ade, MD;  Location: [facility];  Service: Endoscopy;  Laterality: N/A;  9:30 AM - moved to 8:30 - office to notify       Home Medications    Prior to Admission medications   Medication Sig Start Date End Date Taking? Authorizing Provider  cetirizine (ZYRTEC) 10 MG tablet Take 10 mg by mouth daily.    [provider]  Multiple Vitamin (MULTIVITAMIN) tablet Take 1 tablet by mouth daily.    [provider]    Family History Family History  Problem Relation Age of Onset   Stroke Mother    Hypertension Mother    Hypertension Father     Social History Social History   Tobacco Use   Smoking status: Never   Smokeless tobacco: Never  Substance Use Topics   Alcohol use: No    Alcohol/week: 0.0 standard drinks   Drug use: No     Allergies   Penicillins   Review of Systems Review of Systems   Constitutional:  Negative for appetite change, chills and fever.  HENT:  Negative for congestion, ear pain, rhinorrhea, sinus pressure, sinus pain and sore throat.   Eyes:  Negative for redness and visual disturbance.  Respiratory:  Negative for cough, chest tightness, shortness of breath and wheezing.   Cardiovascular:  Negative for chest pain and palpitations.  Gastrointestinal:  Positive for diarrhea. Negative for abdominal pain, constipation, nausea and vomiting.  Genitourinary:  Negative for dysuria, frequency and urgency.  Musculoskeletal:  Negative for myalgias.  Neurological:  Negative for dizziness, weakness and headaches.  Psychiatric/Behavioral:  Negative for confusion.   All other systems reviewed and are negative.   Physical Exam Triage Vital Signs ED Triage Vitals  Enc Vitals Group     BP 09/13/21 1033 138/90     Pulse Rate 09/13/21 1033 100     Resp 09/13/21 1033 18     Temp 09/13/21 1033 97.8 F (36.6 C)     Temp Source 09/13/21 1033 Oral     SpO2 09/13/21 1033 97 %     Weight --      Height --      Head Circumference --      Peak Flow --      Pain Score 09/13/21 1030 0     Pain Loc --      Pain  Edu? --      Excl. in [place]? --    No data found.  Updated Vital Signs BP 138/90 (BP Location: Right Arm)   Pulse 100   Temp 97.8 F (36.6 C) (Oral)   Resp 18   SpO2 97%   Visual Acuity Right Eye Distance:   Left Eye Distance:   Bilateral Distance:    Right Eye Near:   Left Eye Near:    Bilateral Near:     Physical Exam Vitals reviewed.  Constitutional:      General: He is not in acute distress.    Appearance: Normal appearance. He is not ill-appearing.  HENT:     Head: Normocephalic and atraumatic.     Right Ear: Tympanic membrane, ear canal and external ear normal. No tenderness. No middle ear effusion. There is no impacted cerumen. Tympanic membrane is not perforated, erythematous, retracted or bulging.     Left Ear: Tympanic membrane, ear canal  and external ear normal. No tenderness.  No middle ear effusion. There is no impacted cerumen. Tympanic membrane is not perforated, erythematous, retracted or bulging.     Nose: Nose normal. No congestion.     Mouth/Throat:     Mouth: Mucous membranes are moist.     Pharynx: Uvula midline. No oropharyngeal exudate or posterior oropharyngeal erythema.  Eyes:     Extraocular Movements: Extraocular movements intact.     Pupils: Pupils are equal, round, and reactive to light.  Cardiovascular:     Rate and Rhythm: Normal rate and regular rhythm.     Heart sounds: Normal heart sounds.  Pulmonary:     Effort: Pulmonary effort is normal.     Breath sounds: Normal breath sounds. No decreased breath sounds, wheezing, rhonchi or rales.  Abdominal:     Palpations: Abdomen is soft.     Tenderness: There is no abdominal tenderness. There is no guarding or rebound.  Lymphadenopathy:     Cervical: No cervical adenopathy.     Right cervical: No superficial cervical adenopathy.    Left cervical: No superficial cervical adenopathy.  Neurological:     General: No focal deficit present.     Mental Status: He is alert and oriented to person, place, and time.  Psychiatric:        Mood and Affect: Mood normal.        Behavior: Behavior normal.        Thought Content: Thought content normal.        Judgment: Judgment normal.     UC Treatments / Results  Labs (all labs ordered are listed, but only abnormal results are displayed) Labs Reviewed - No data to display  EKG   Radiology No results found.  Procedures Procedures (including critical care time)  Medications Ordered in UC Medications - No data to display  Initial Impression / Assessment and Plan / UC Course  I have reviewed the triage vital signs and the nursing notes.  Pertinent labs & imaging results that were available during my care of the patient were reviewed by me and considered in my medical decision making (see chart for  details).     This patient is a very pleasant 55 y.o. year old male presenting with diarrhea related to influenza. Today this pt is afebrile nontachycardic nontachypneic, oxygenating well on room air, no wheezes rhonchi or rales.  Appears well-hydrated.  Diarrhea is completely controlled on Imodium over-the-counter, continue this.  He is on day 7 of present illness, so did  not send a influenza test. ED return precautions discussed. Patient verbalizes understanding and agreement.  .   Final Clinical Impressions(s) / UC Diagnoses   Final diagnoses:  Influenza with respiratory manifestation  Diarrhea, unspecified type     Discharge Instructions      -Take the Imodium (loperamide) up to 4 times daily for diarrhea. -Drink plenty of fluids -With a virus, you're typically contagious for 5-7 days, or as long as you're having fevers.     ED Prescriptions   None    PDMP not reviewed this encounter.   Hazel Sams, PA-C 09/13/21 1056

## 2021-09-13 NOTE — ED Triage Notes (Signed)
Patient states last week he thinks he had the Flu but he started feeling better but yesterday he woke up with watery diarrhea x3.   He states he has been taking imodium with some relief.  States he still has a lingering cough a some congestion from what he thinks was the Flu last week.   Denies Fever.  No appetite.

## 2024-03-05 ENCOUNTER — Other Ambulatory Visit: Payer: Self-pay | Admitting: Medical Genetics

## 2024-03-13 ENCOUNTER — Other Ambulatory Visit (HOSPITAL_COMMUNITY)
Admission: RE | Admit: 2024-03-13 | Discharge: 2024-03-13 | Disposition: A | Discharge status: Home / Self Care | Payer: Self-pay | Source: Ambulatory Visit | Attending: Oncology | Admitting: Oncology

## 2024-03-24 LAB — GENECONNECT MOLECULAR SCREEN: Genetic Analysis Overall Interpretation: NEGATIVE
